# Patient Record
Sex: Male | Born: 1975 | Race: Black or African American | Hispanic: No | Marital: Single | Smoking: Current every day smoker
Health system: Southern US, Community
[De-identification: ages and names within clinical notes are randomized; demographics above are authoritative.]

---

## 1999-08-21 ENCOUNTER — Encounter: Payer: Self-pay | Admitting: Emergency Medicine

## 1999-08-21 ENCOUNTER — Emergency Department (HOSPITAL_COMMUNITY): Admission: EM | Admit: 1999-08-21 | Discharge: 1999-08-21 | Payer: Self-pay | Admitting: Emergency Medicine

## 2017-09-03 ENCOUNTER — Emergency Department: Payer: BLUE CROSS/BLUE SHIELD

## 2017-09-03 ENCOUNTER — Emergency Department
Admission: EM | Admit: 2017-09-03 | Discharge: 2017-09-03 | Disposition: A | Discharge status: Home / Self Care | Payer: BLUE CROSS/BLUE SHIELD | Attending: Student in an Organized Health Care Education/Training Program | Admitting: Student in an Organized Health Care Education/Training Program

## 2017-09-03 ENCOUNTER — Encounter: Payer: Self-pay | Admitting: Emergency Medicine

## 2017-09-03 ENCOUNTER — Other Ambulatory Visit: Payer: Self-pay

## 2017-09-03 DIAGNOSIS — S61209A Unspecified open wound of unspecified finger without damage to nail, initial encounter: Secondary | ICD-10-CM | POA: Diagnosis not present

## 2017-09-03 DIAGNOSIS — F17200 Nicotine dependence, unspecified, uncomplicated: Secondary | ICD-10-CM | POA: Diagnosis not present

## 2017-09-03 DIAGNOSIS — S63279A Dislocation of unspecified interphalangeal joint of unspecified finger, initial encounter: Secondary | ICD-10-CM | POA: Diagnosis not present

## 2017-09-03 DIAGNOSIS — Y998 Other external cause status: Secondary | ICD-10-CM | POA: Insufficient documentation

## 2017-09-03 DIAGNOSIS — W298XXA Contact with other powered powered hand tools and household machinery, initial encounter: Secondary | ICD-10-CM | POA: Insufficient documentation

## 2017-09-03 DIAGNOSIS — S6991XA Unspecified injury of right wrist, hand and finger(s), initial encounter: Secondary | ICD-10-CM | POA: Diagnosis present

## 2017-09-03 DIAGNOSIS — Y93H3 Activity, building and construction: Secondary | ICD-10-CM | POA: Insufficient documentation

## 2017-09-03 DIAGNOSIS — S61220A Laceration with foreign body of right index finger without damage to nail, initial encounter: Secondary | ICD-10-CM | POA: Diagnosis not present

## 2017-09-03 DIAGNOSIS — Z23 Encounter for immunization: Secondary | ICD-10-CM | POA: Diagnosis not present

## 2017-09-03 DIAGNOSIS — Y929 Unspecified place or not applicable: Secondary | ICD-10-CM | POA: Insufficient documentation

## 2017-09-03 MED ORDER — TETANUS-DIPHTH-ACELL PERTUSSIS 5-2.5-18.5 LF-MCG/0.5 IM SUSP
0.5000 mL | Freq: Once | INTRAMUSCULAR | Status: AC
  Administered 2017-09-03: 0.5 mL via INTRAMUSCULAR
  Filled 2017-09-03: qty 0.5

## 2017-09-03 MED ORDER — CEPHALEXIN 500 MG PO CAPS: 500.0000 mg | ORAL_CAPSULE | Freq: Four times a day (QID) | ORAL | 0 refills | Status: AC

## 2017-09-03 MED ORDER — MORPHINE SULFATE (PF) 4 MG/ML IV SOLN
8.0000 mg | INTRAVENOUS | Status: DC | PRN
  Administered 2017-09-03: 8 mg via INTRAMUSCULAR
  Filled 2017-09-03: qty 2

## 2017-09-03 MED ORDER — HYDROCODONE-ACETAMINOPHEN 5-325 MG PO TABS: 1.0000 | ORAL_TABLET | ORAL | 0 refills | Status: AC | PRN

## 2017-09-03 MED ORDER — LIDOCAINE-EPINEPHRINE-TETRACAINE (LET) SOLUTION
3.0000 mL | Freq: Once | NASAL | Status: AC
  Administered 2017-09-03: 3 mL via TOPICAL
  Filled 2017-09-03: qty 3

## 2017-09-03 MED ORDER — BUPIVACAINE HCL (PF) 0.5 % IJ SOLN
30.0000 mL | Freq: Once | INTRAMUSCULAR | Status: AC
  Administered 2017-09-03: 30 mL
  Filled 2017-09-03: qty 30

## 2017-09-03 MED ORDER — BACITRACIN ZINC 500 UNIT/GM EX OINT
TOPICAL_OINTMENT | CUTANEOUS | Status: AC
  Filled 2017-09-03: qty 1.8

## 2017-09-03 NOTE — Discharge Instructions (Signed)
Follow-up with Dr. Bryson HaSoria's office tomorrow.  Call for them to work nightly to clinic.  She has given her office permission to fit you into her schedule tomorrow.  Keep arm elevated while at rest.  Be sure to take Motrin or Tylenol for management of baseline discomfort and take pain medications as needed for severe pain.  Be sure to take antibiotics 3 times daily to help reduce risk of infection.  Return for any worsening pain or fevers.

## 2017-09-03 NOTE — ED Triage Notes (Signed)
Presents with laceration to right thumb and index finger from a table saw

## 2017-09-03 NOTE — ED Notes (Signed)
ED Provider Dr. Roxan Hockeyobinson at bedside.

## 2017-09-03 NOTE — ED Provider Notes (Signed)
[facility] Emergency Department Provider Note    First MD Initiated Contact with Patient 09/03/17 1303     (approximate)  I have reviewed the triage vital signs and the nursing notes.   HISTORY  Chief Complaint Laceration    HPI Paul Davis is a 41 y.o. 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  Describes the pain is moderate to severe with no radiation.  No other associated injuries.  Does not member last tetanus.  History reviewed. No pertinent past medical history. No family history on file. History reviewed. No pertinent surgical history. There are no active problems to display for this patient.     Prior to Admission medications   Medication Sig Start Date End Date Taking? Authorizing Provider  cephALEXin (KEFLEX) 500 MG capsule Take 1 capsule (500 mg total) by mouth 4 (four) times daily for 7 days. 09/03/17 09/10/17  Willy Eddyobinson, Havier Deeb, MD  HYDROcodone-acetaminophen (NORCO) 5-325 MG tablet Take 1 tablet by mouth every 4 (four) hours as needed for moderate pain. 09/03/17   Willy Eddyobinson, Hildreth Orsak, MD    Allergies Patient has no known allergies.    Social History Social History   Tobacco Use  . Smoking status: Current Every Day Smoker  . Smokeless tobacco: Never Used  Substance Use Topics  . Alcohol use: Yes  . Drug use: Not on file    Review of Systems Patient denies headaches, rhinorrhea, blurry vision, numbness, shortness of breath, chest pain, edema, cough, abdominal pain, nausea, vomiting, diarrhea, dysuria, fevers, rashes or hallucinations unless otherwise stated above in HPI. ____________________________________________   PHYSICAL EXAM:  VITAL SIGNS: Vitals:   09/03/17 1137  BP: (!) 102/59  Pulse:  92  Resp: 18  Temp: 97.7 F (36.5 C)  SpO2: 100%    Constitutional: Alert and oriented. Well appearing and in no acute distress. Eyes: Conjunctivae are normal.  Head: Atraumatic. Nose: No congestion/rhinnorhea. Mouth/Throat: Mucous membranes are moist.   Neck: No stridor. Painless ROM.  Cardiovascular: Normal rate, regular rhythm. Grossly normal heart sounds.  Good peripheral circulation. Respiratory: Normal respiratory effort.  No retractions. Lungs CTAB. Gastrointestinal: Soft and nontender. No distention. No abdominal bruits. No CVA tenderness. Genitourinary:  Musculoskeletal: Probably 4 cm full-thickness laceration on the dorsal aspect of his right second digit that does cross middle and distal interphalangeal joint with obvious volar deformity and shortening.  There is no evidence of laceration on the flexor tendons but does appear that the joint capsule and probable tendon sheath have been disrupted.  There is small areas of debris.  no lower extremity tenderness nor edema.  No joint effusions. Neurologic:  Normal speech and language. No gross focal neurologic deficits are appreciated. No facial droop Skin:  Skin is warm, dry and intact. No rash noted. Psychiatric: Mood and affect are normal. Speech and behavior are normal.  ____________________________________________   LABS (all labs ordered are listed, but only abnormal results are displayed)  No results found for this or any previous visit (from the past 24 hour(s)). ____________________________________________ ____________________________________________  RADIOLOGY  I personally reviewed all radiographic images ordered to evaluate for the above acute complaints and reviewed radiology reports and findings.  These findings were personally discussed with the patient.  Please see medical record for radiology report.  ____________________________________________  PROCEDURES  Procedure(s) performed:  Reduction of  dislocation Date/Time: 09/03/2017 2:46 PM Performed by: Willy Eddyobinson, Kirtan Sada, MD Authorized by: Willy Eddyobinson, Calley Drenning, MD  Consent: Verbal consent obtained. Consent given by: patient Patient understanding: patient states understanding of the procedure being performed Imaging studies: imaging studies available Patient identity confirmed: verbally with patient and arm band Time out: Immediately prior to procedure a "time out" was called to verify the correct patient, procedure, equipment, support staff and site/side marked as required. Preparation: Patient was prepped and draped in the usual sterile fashion. Local anesthesia used: yes Anesthesia: digital block  Anesthesia: Local anesthesia used: yes Local Anesthetic: bupivacaine 0.5% without epinephrine Anesthetic total: 5 mL Patient tolerance: Patient tolerated the procedure well with no immediate complications Comments: Reduction of interphalangeal fracture dislocation performed with axial traction and proximal pressure and reapproximation.  Marland Kitchen..Laceration Repair Date/Time: 09/03/2017 3:44 PM Performed by: Willy Eddyobinson, Jakyle Petrucelli, MD Authorized by: Willy Eddyobinson, Tashae Inda, MD   Consent:    Consent obtained:  Verbal   Consent given by:  Patient   Risks discussed:  Infection, pain, retained foreign body, poor cosmetic result and poor wound healing Anesthesia (see MAR for exact dosages):    Anesthesia method:  Nerve block   Block location:  Ring block   Block needle gauge:  25 G   Block anesthetic:  Bupivacaine 0.5% w/o epi Repair type:    Repair type:  Complex Pre-procedure details:    Preparation:  Patient was prepped and draped in usual sterile fashion Exploration:    Hemostasis achieved with:  Direct pressure   Wound exploration: wound explored through full range of motion and entire depth of wound probed and visualized     Wound extent: fascia violated, foreign bodies/material, underlying fracture and vascular damage     Wound extent: no  nerve damage noted and no tendon damage noted     Contaminated: yes   Treatment:    Area cleansed with:  Saline, Hibiclens and Betadine   Amount of cleaning:  Extensive   Irrigation solution:  Sterile saline   Irrigation volume:  1L   Irrigation method:  Syringe   Visualized foreign bodies/material removed: yes   Subcutaneous repair:    Suture size:  5-0   Suture technique:  Simple interrupted   Number of sutures:  3 Skin repair:    Repair method:  Sutures   Suture size:  5-0   Suture material:  Nylon   Suture technique:  Simple interrupted   Number of sutures:  15 Approximation:    Approximation:  Loose Post-procedure details:    Dressing:  Sterile dressing, antibiotic ointment, splint for protection and bulky dressing   Patient tolerance of procedure:  Tolerated well, no immediate complications      Critical Care performed: no ____________________________________________   INITIAL IMPRESSION / ASSESSMENT AND PLAN / ED COURSE  Pertinent labs & imaging results that were available during my care of the patient were reviewed by me and considered in my medical decision making (see chart for details).  DDX: Fracture, dislocation, tendon injury, laceration, contusion  Paul Davis is a 41 y.o. who presents to the ED with extensive injury to right dominant hand second digit and thumb as described above.  Tetanus was updated.  Extensive wound irrigation and soaking in Betadine and Hibiclens with pressure irrigation performed to reduce risk of infection.  Reduction successfully accomplished after ring block with adequate and appropriate range of motion.  Surprisingly patient has flexor strength which is intact.  Neurovascularly intact as well.  I spoke with Dr. Stephenie Acres of orthopedic hand surgery regarding my concern for possible joint violation and ligamentous injury.  I have loosely approximated laceration as described above placed in bulky splint and dressing and will start the  patient on oral antibiotics with follow-up with Dr. Stephenie Acres tomorrow for reexamination.      ____________________________________________   FINAL CLINICAL IMPRESSION(S) / ED DIAGNOSES  Final diagnoses:  Laceration of right index finger with foreign body, nail damage status unspecified, initial encounter  Dislocation of finger, interphalangeal joint, right, open, initial encounter      NEW MEDICATIONS STARTED DURING THIS VISIT:  This SmartLink is deprecated. Use AVSMEDLIST instead to display the medication list for a patient.   Note:  This document was prepared using Dragon voice recognition software and may include unintentional dictation errors.    Willy Eddy, MD 09/03/17 [phone]

## 2018-11-16 ENCOUNTER — Emergency Department (HOSPITAL_COMMUNITY): Payer: BLUE CROSS/BLUE SHIELD

## 2018-11-16 ENCOUNTER — Encounter (HOSPITAL_COMMUNITY): Payer: Self-pay | Admitting: Emergency Medicine

## 2018-11-16 ENCOUNTER — Emergency Department (HOSPITAL_COMMUNITY)
Admission: EM | Admit: 2018-11-16 | Discharge: 2018-11-16 | Disposition: A | Discharge status: Home / Self Care | Payer: BLUE CROSS/BLUE SHIELD | Attending: Emergency Medicine | Admitting: Emergency Medicine

## 2018-11-16 DIAGNOSIS — Y929 Unspecified place or not applicable: Secondary | ICD-10-CM | POA: Insufficient documentation

## 2018-11-16 DIAGNOSIS — T148XXA Other injury of unspecified body region, initial encounter: Secondary | ICD-10-CM

## 2018-11-16 DIAGNOSIS — F17210 Nicotine dependence, cigarettes, uncomplicated: Secondary | ICD-10-CM | POA: Insufficient documentation

## 2018-11-16 DIAGNOSIS — F10920 Alcohol use, unspecified with intoxication, uncomplicated: Secondary | ICD-10-CM | POA: Insufficient documentation

## 2018-11-16 DIAGNOSIS — Y939 Activity, unspecified: Secondary | ICD-10-CM | POA: Insufficient documentation

## 2018-11-16 DIAGNOSIS — S21112A Laceration without foreign body of left front wall of thorax without penetration into thoracic cavity, initial encounter: Secondary | ICD-10-CM | POA: Diagnosis present

## 2018-11-16 DIAGNOSIS — Y999 Unspecified external cause status: Secondary | ICD-10-CM | POA: Insufficient documentation

## 2018-11-16 DIAGNOSIS — R112 Nausea with vomiting, unspecified: Secondary | ICD-10-CM | POA: Insufficient documentation

## 2018-11-16 DIAGNOSIS — Y906 Blood alcohol level of 120-199 mg/100 ml: Secondary | ICD-10-CM | POA: Diagnosis not present

## 2018-11-16 LAB — TYPE AND SCREEN
ABO/RH(D): O POS
ANTIBODY SCREEN: NEGATIVE
Unit division: 0
Unit division: 0

## 2018-11-16 LAB — CBC WITH DIFFERENTIAL/PLATELET
Abs Immature Granulocytes: 0.04 10*3/uL (ref 0.00–0.07)
Basophils Absolute: 0.1 10*3/uL (ref 0.0–0.1)
Basophils Relative: 1 %
Eosinophils Absolute: 0.5 10*3/uL (ref 0.0–0.5)
Eosinophils Relative: 4 %
HCT: 38.5 % — ABNORMAL LOW (ref 39.0–52.0)
Hemoglobin: 13.1 g/dL (ref 13.0–17.0)
Immature Granulocytes: 0 %
LYMPHS PCT: 26 %
Lymphs Abs: 3.2 10*3/uL (ref 0.7–4.0)
MCH: 33.9 pg (ref 26.0–34.0)
MCHC: 34 g/dL (ref 30.0–36.0)
MCV: 99.7 fL (ref 80.0–100.0)
Monocytes Absolute: 1.1 10*3/uL — ABNORMAL HIGH (ref 0.1–1.0)
Monocytes Relative: 9 %
Neutro Abs: 7.4 10*3/uL (ref 1.7–7.7)
Neutrophils Relative %: 60 %
Platelets: 291 10*3/uL (ref 150–400)
RBC: 3.86 MIL/uL — AB (ref 4.22–5.81)
RDW: 12.1 % (ref 11.5–15.5)
WBC: 12.4 10*3/uL — AB (ref 4.0–10.5)
nRBC: 0 % (ref 0.0–0.2)

## 2018-11-16 LAB — PREPARE FRESH FROZEN PLASMA
UNIT DIVISION: 0
Unit division: 0

## 2018-11-16 LAB — COMPREHENSIVE METABOLIC PANEL
ALT: 19 U/L (ref 0–44)
AST: 30 U/L (ref 15–41)
Albumin: 3.9 g/dL (ref 3.5–5.0)
Alkaline Phosphatase: 52 U/L (ref 38–126)
Anion gap: 13 (ref 5–15)
BUN: 11 mg/dL (ref 6–20)
CO2: 18 mmol/L — ABNORMAL LOW (ref 22–32)
CREATININE: 1.1 mg/dL (ref 0.61–1.24)
Calcium: 8.5 mg/dL — ABNORMAL LOW (ref 8.9–10.3)
Chloride: 110 mmol/L (ref 98–111)
GFR calc non Af Amer: >60 mL/min (ref >60)
Glucose, Bld: 101 mg/dL — ABNORMAL HIGH (ref 70–99)
Potassium: 3.3 mmol/L — ABNORMAL LOW (ref 3.5–5.1)
Sodium: 141 mmol/L (ref 135–145)
Total Bilirubin: 0.4 mg/dL (ref 0.3–1.2)
Total Protein: 7 g/dL (ref 6.5–8.1)

## 2018-11-16 LAB — BPAM RBC
Blood Product Expiration Date: 202002232359
Blood Product Expiration Date: 202002242359
ISSUE DATE / TIME: 202002081120
ISSUE DATE / TIME: 202002081130
Unit Type and Rh: 9500
Unit Type and Rh: 9500

## 2018-11-16 LAB — BPAM FFP
Blood Product Expiration Date: 202002182359
Blood Product Expiration Date: 202002182359
ISSUE DATE / TIME: 202002080454
ISSUE DATE / TIME: 202002080454
Unit Type and Rh: 6200
Unit Type and Rh: 6200

## 2018-11-16 LAB — ABO/RH: ABO/RH(D): O POS

## 2018-11-16 LAB — LIPASE, BLOOD: Lipase: 35 U/L (ref 11–51)

## 2018-11-16 LAB — ETHANOL: Alcohol, Ethyl (B): 151 mg/dL — ABNORMAL HIGH (ref <10)

## 2018-11-16 LAB — TROPONIN I: Troponin I: <0.03 ng/mL (ref <0.03)

## 2018-11-16 LAB — PROTIME-INR
INR: 0.98
Prothrombin Time: 12.9 seconds (ref 11.4–15.2)

## 2018-11-16 MED ORDER — IBUPROFEN 600 MG PO TABS: 600.0000 mg | ORAL_TABLET | Freq: Three times a day (TID) | ORAL | 0 refills | Status: AC | PRN

## 2018-11-16 MED ORDER — IOHEXOL 300 MG/ML  SOLN
75.0000 mL | Freq: Once | INTRAMUSCULAR | Status: AC | PRN
  Administered 2018-11-16: 75 mL via INTRAVENOUS

## 2018-11-16 MED ORDER — CEPHALEXIN 500 MG PO CAPS: 500.0000 mg | ORAL_CAPSULE | Freq: Three times a day (TID) | ORAL | 0 refills | Status: AC

## 2018-11-16 MED ORDER — LIDOCAINE-EPINEPHRINE (PF) 2 %-1:200000 IJ SOLN
10.0000 mL | Freq: Once | INTRAMUSCULAR | Status: AC
  Administered 2018-11-16: 10 mL via INTRADERMAL
  Filled 2018-11-16: qty 20

## 2018-11-16 MED ORDER — LIDOCAINE HCL (PF) 1 % IJ SOLN: 5.0000 mL | Freq: Once | INTRAMUSCULAR | Status: DC
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
## 2018-11-16 NOTE — Discharge Instructions (Addendum)
Follow up in 7 - 10 days for suture removal °

## 2018-11-16 NOTE — ED Provider Notes (Signed)
  Procedures .Marland Kitchen.Laceration Repair Date/Time: 11/16/2018 6:43 AM Performed by: Cherly AndersonPetrucelli, Shonita Rinck R, PA-C Authorized by: Cherly AndersonPetrucelli, Fantasia Jinkins R, PA-C   Consent:    Consent obtained:  Verbal   Consent given by:  Patient   Risks discussed:  Infection, need for additional repair, nerve damage, poor wound healing, poor cosmetic result, pain, retained foreign body, tendon damage and vascular damage   Alternatives discussed:  No treatment Anesthesia (see MAR for exact dosages):    Anesthesia method:  Local infiltration   Local anesthetic:  Lidocaine 2% WITH epi Laceration details:    Location:  Trunk   Trunk location:  L chest   Length (cm):  2.5 Repair type:    Repair type:  Simple Pre-procedure details:    Preparation:  Patient was prepped and draped in usual sterile fashion and imaging obtained to evaluate for foreign bodies Exploration:    Hemostasis achieved with:  Direct pressure   Wound exploration: wound explored through full range of motion and entire depth of wound probed and visualized     Wound extent: areolar tissue violated   Treatment:    Area cleansed with:  Betadine   Amount of cleaning:  Standard   Irrigation solution:  Sterile water   Irrigation method:  Pressure wash Skin repair:    Repair method:  Sutures   Suture size:  4-0   Suture material:  Nylon   Suture technique:  Simple interrupted   Number of sutures:  3 Approximation:    Approximation:  Close Post-procedure details:    Dressing:  Antibiotic ointment and non-adherent dressing   Patient tolerance of procedure:  Tolerated well, no immediate complications     Cherly Andersonetrucelli, Shaye Elling R, PA-C 11/16/18 [id number]    Shaune PollackIsaacs, Cameron, MD 11/17/18 [phone]

## 2018-11-16 NOTE — Progress Notes (Signed)
Patient is alert and denies family contact. Provided the ministry of presence.

## 2018-11-16 NOTE — ED Notes (Signed)
Returned from CT scan with no distress, respirations unlabored , IV sites intact , [employer] officer at bedside .

## 2018-11-18 ENCOUNTER — Encounter: Payer: Self-pay | Admitting: Emergency Medicine
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
